# Patient Record
Sex: Female | Born: 1955 | Race: White | Hispanic: No | Marital: Married | Smoking: Current every day smoker
Health system: Southern US, Community
[De-identification: ages and names within clinical notes are randomized; demographics above are authoritative.]

## PROBLEM LIST (undated history)

## (undated) DIAGNOSIS — C801 Malignant (primary) neoplasm, unspecified: Secondary | ICD-10-CM

## (undated) DIAGNOSIS — L0100 Impetigo, unspecified: Secondary | ICD-10-CM

## (undated) DIAGNOSIS — J45909 Unspecified asthma, uncomplicated: Secondary | ICD-10-CM

## (undated) HISTORY — PX: CHOLECYSTECTOMY: SHX55

## (undated) HISTORY — PX: ABDOMINAL HYSTERECTOMY: SHX81

## (undated) HISTORY — PX: EYE SURGERY: SHX253

---

## 2004-09-25 ENCOUNTER — Ambulatory Visit: Payer: Self-pay | Admitting: Family Medicine

## 2005-11-25 ENCOUNTER — Ambulatory Visit: Payer: Self-pay | Admitting: Family Medicine

## 2006-08-02 IMAGING — US MM MAMMO DIAGNOSTIC UNILATERAL*R*
1 series · 16 of 16 positions shown · non-contrast
Comparison: none

REASON FOR EXAM: Six-month followup RIGHT breast nodules
COMMENTS:

[Series 1: mm mammo diagnostic unilateral*right* · 16 of 19 slices shown]
[im 1/19]
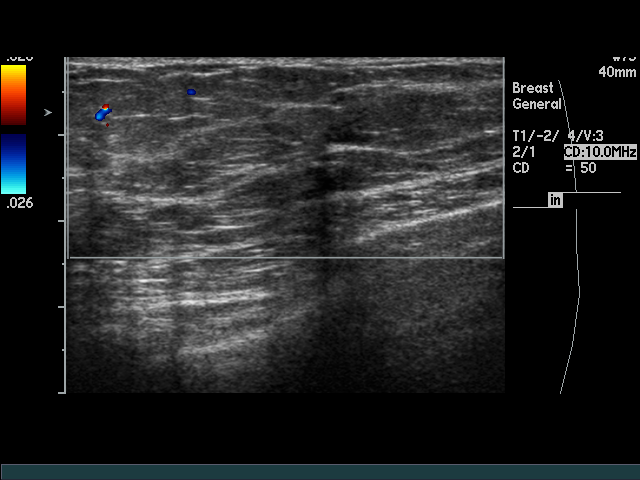
[im 2/19]
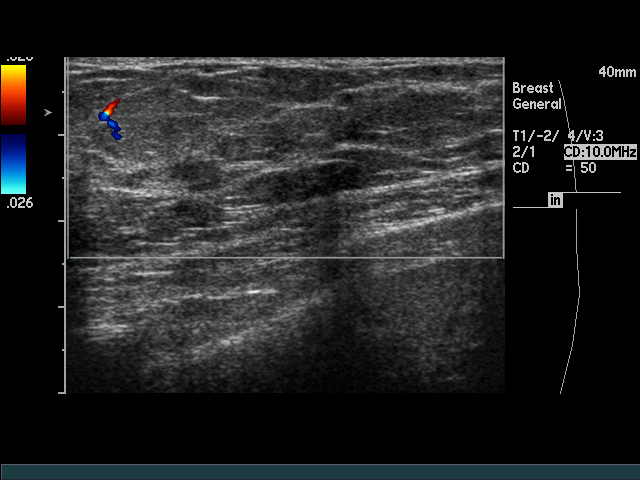
[im 3/19]
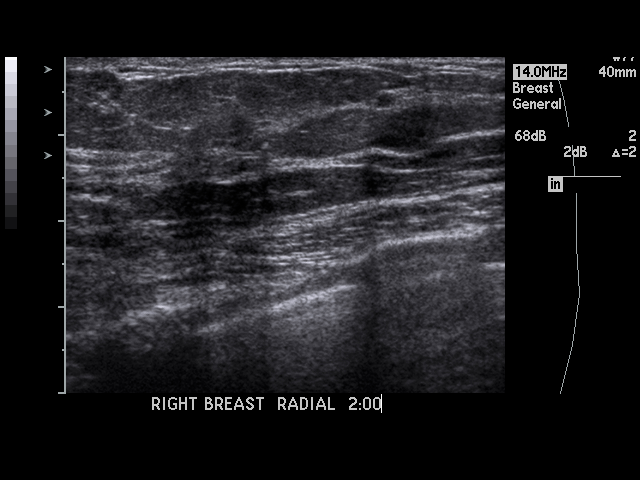
[im 4/19]
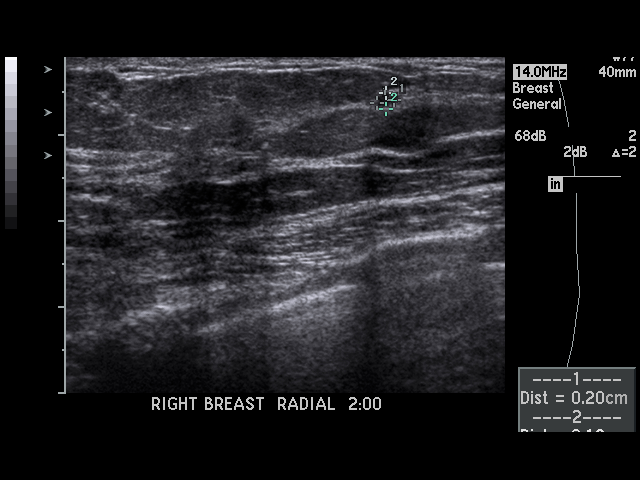
[im 5/19]
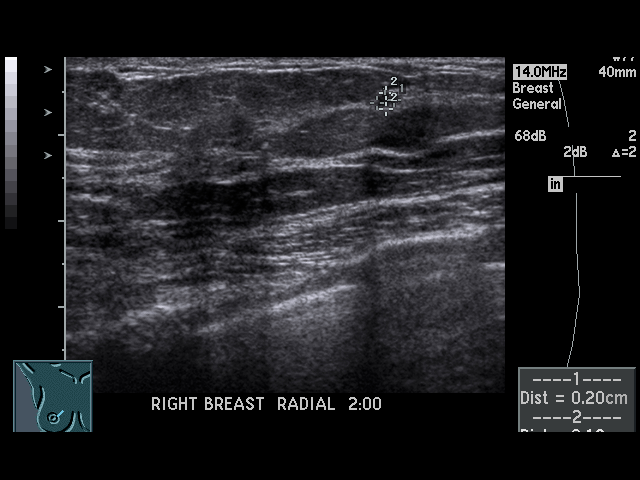
[im 7/19]
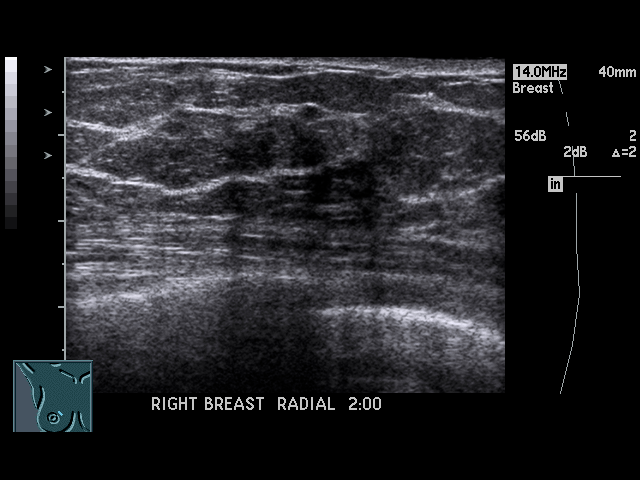
[im 8/19]
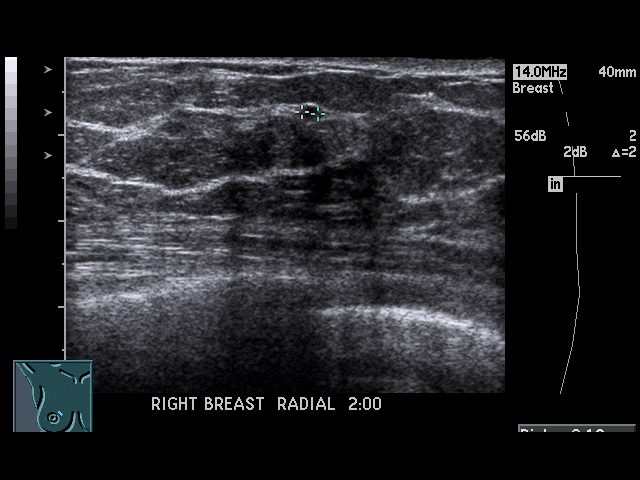
[im 9/19]
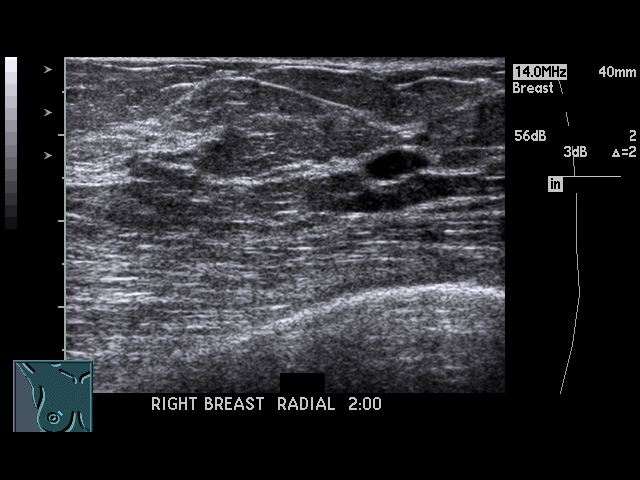
[im 10/19]
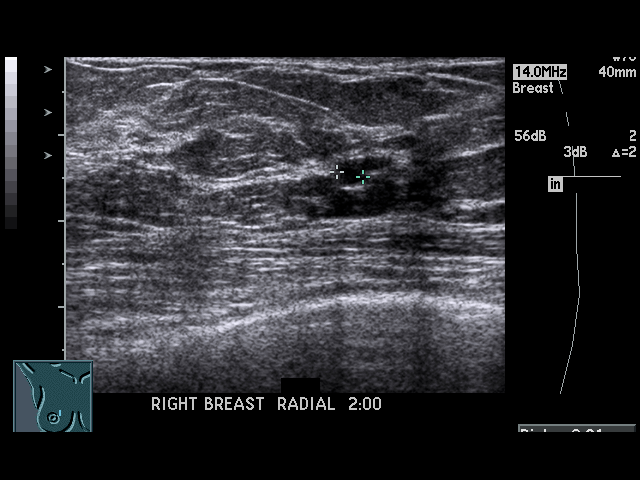
[im 11/19]
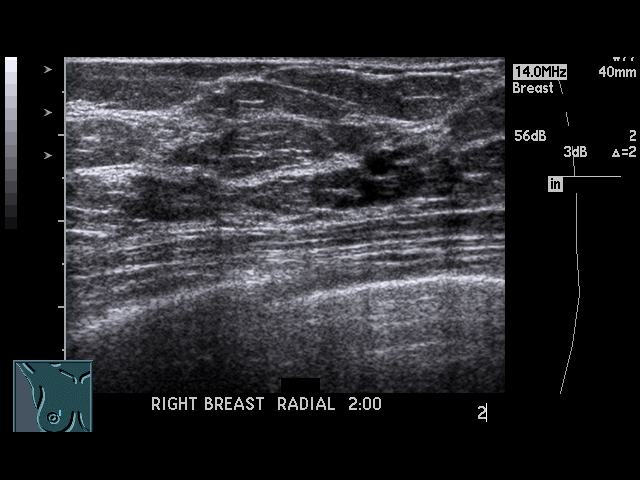
[im 13/19]
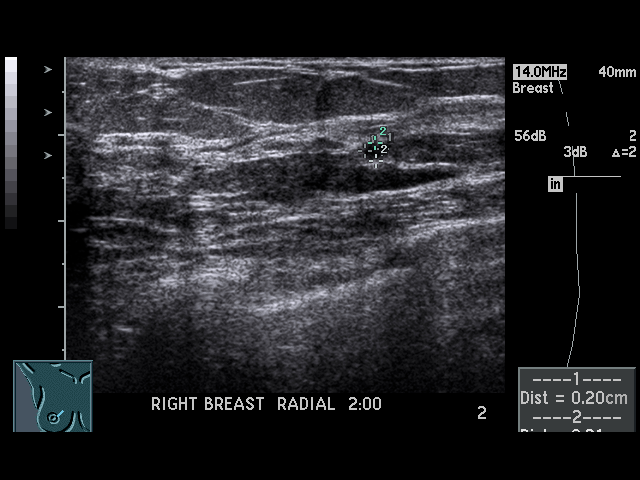
[im 14/19]
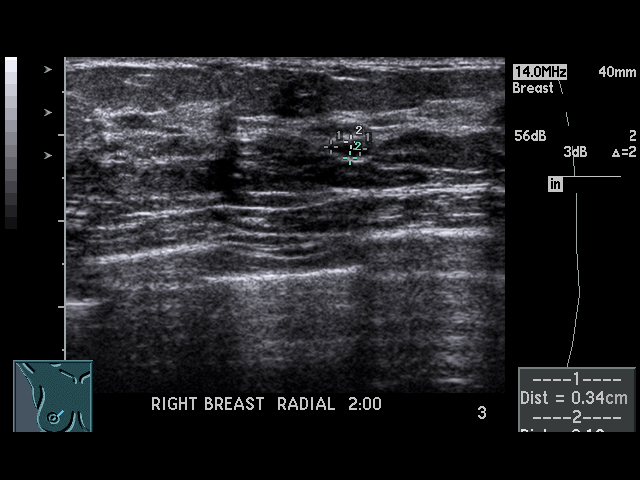
[im 15/19]
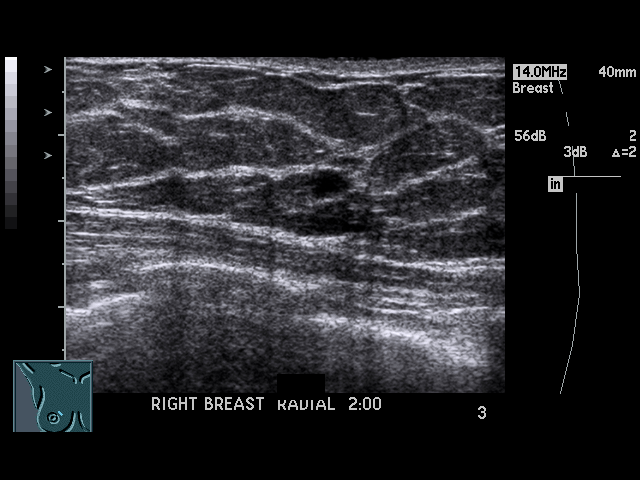
[im 16/19]
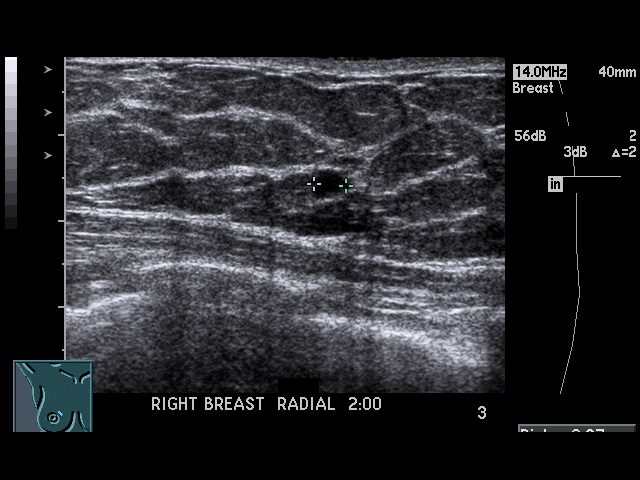
[im 17/19]
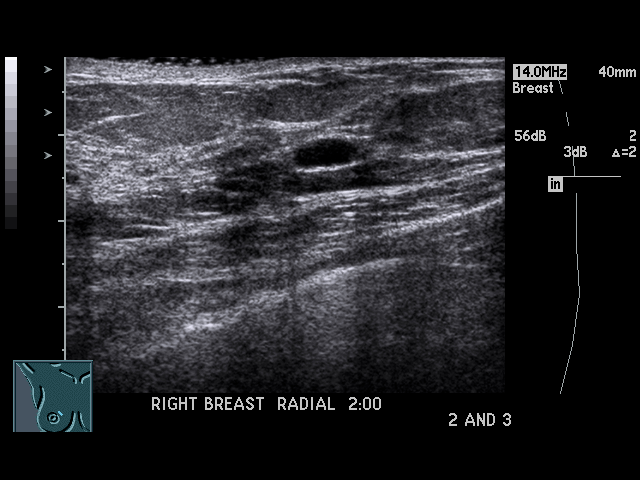
[im 19/19]
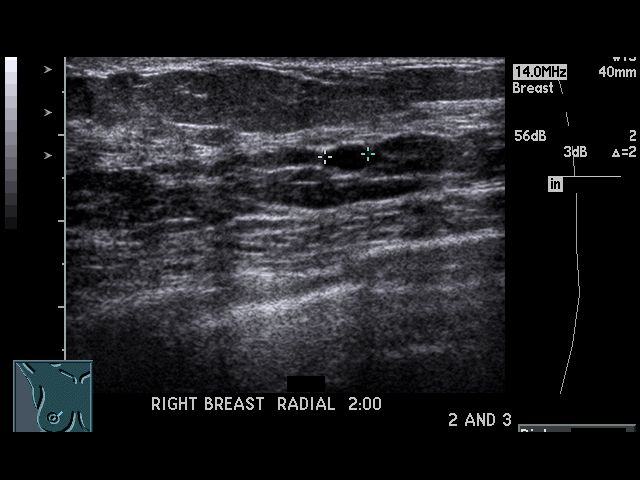

[16 of 16 positions shown; findings below may reference images not displayed]

PROCEDURE:     MAM - MAM DGTL UNI MAM RT BREAST W/CAD  - September 25, 2004  [DATE]

RESULT:     At the time of the prior study of 02-25-04, nodularity at the 2
o'clock position on the RIGHT was seen and was felt to represent simple
cystic structures.  On today's study the RIGHT breast remains moderately
dense.  Nodularity is noted in the medial aspect of the RIGHT breast in a
fashion similar to that seen in the past.  Ultrasound performed today
reveals no new mass.  There are tiny simple-appearing cystic structures
present at the 2 o'clock position.
IMPRESSION: I see no findings suspicious for malignancy.

BI-RADS: Category 2 - Benign Finding.

RECOMMENDATION: Please return to yearly mammographic followup of both
breasts i.e. in [REDACTED] or March 2005.

A NEGATIVE MAMMOGRAM REPORT DOES NOT PRECLUDE BIOPSY OR OTHER EVALUATION OF
A CLINICALLY PALPABLE OR OTHERWISE SUSPICIOUS MASS OR LESION.  BREAST CANCER
MAY NOT BE DETECTED BY MAMMOGRAPHY IN UP TO 10% OF CASES.

## 2006-12-01 ENCOUNTER — Ambulatory Visit: Payer: Self-pay | Admitting: Family Medicine

## 2009-09-20 ENCOUNTER — Emergency Department: Payer: Self-pay | Admitting: Emergency Medicine

## 2009-09-22 ENCOUNTER — Ambulatory Visit: Payer: Self-pay | Admitting: Surgery

## 2011-07-28 IMAGING — CR DG ABDOMEN 2V
1 series · 2 of 2 positions shown · non-contrast
Comparison: none

REASON FOR EXAM: ABDOMINAL PAIN
COMMENTS:   May transport without cardiac monitor

PROCEDURE:     DXR - DXR ABDOMEN 2 V FLAT AND ERECT  - September 20, 2009  [DATE]
RESULT:
Air is seen within nondilated loops of large and small bowel. The visualized
bony skeleton demonstrates no evidence of fracture nor dislocation. There is
no evidence of free air.

[Series 1: view not recorded · 0.17mm/px · 2 of 2 slices shown]
[im 1/2]
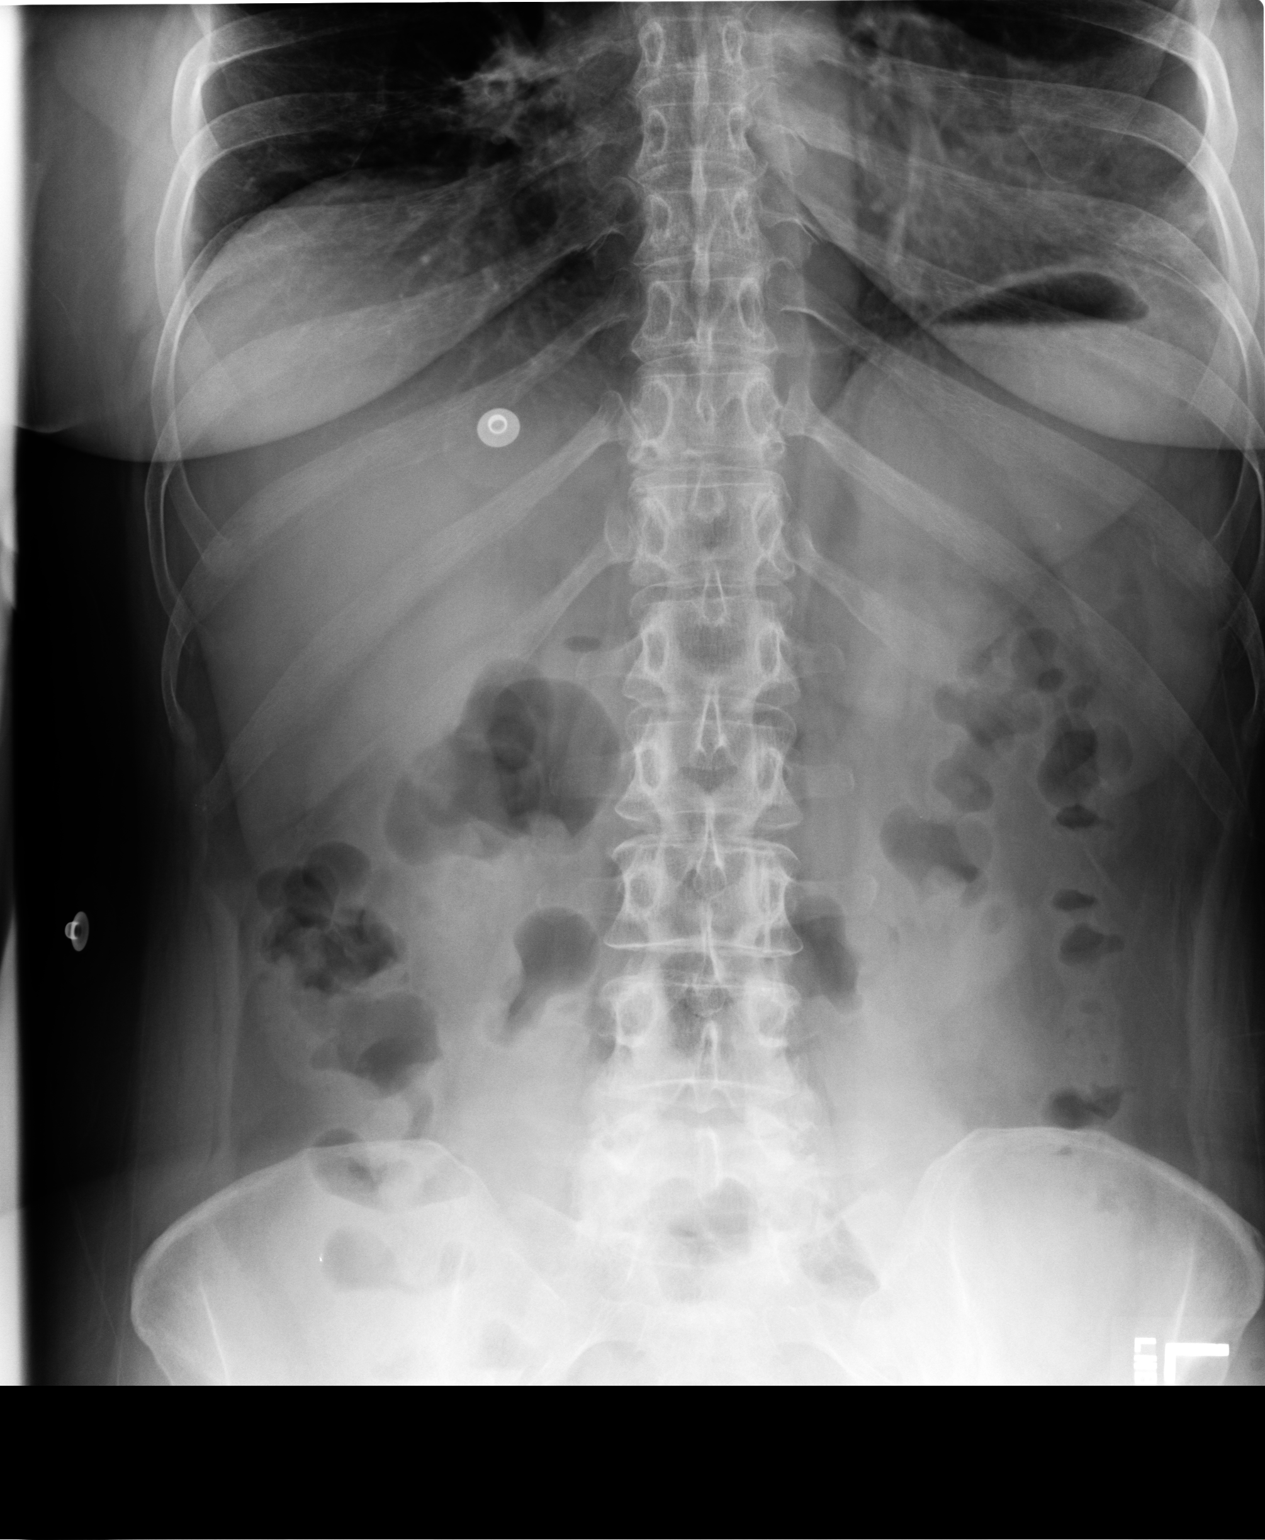
[im 2/2]
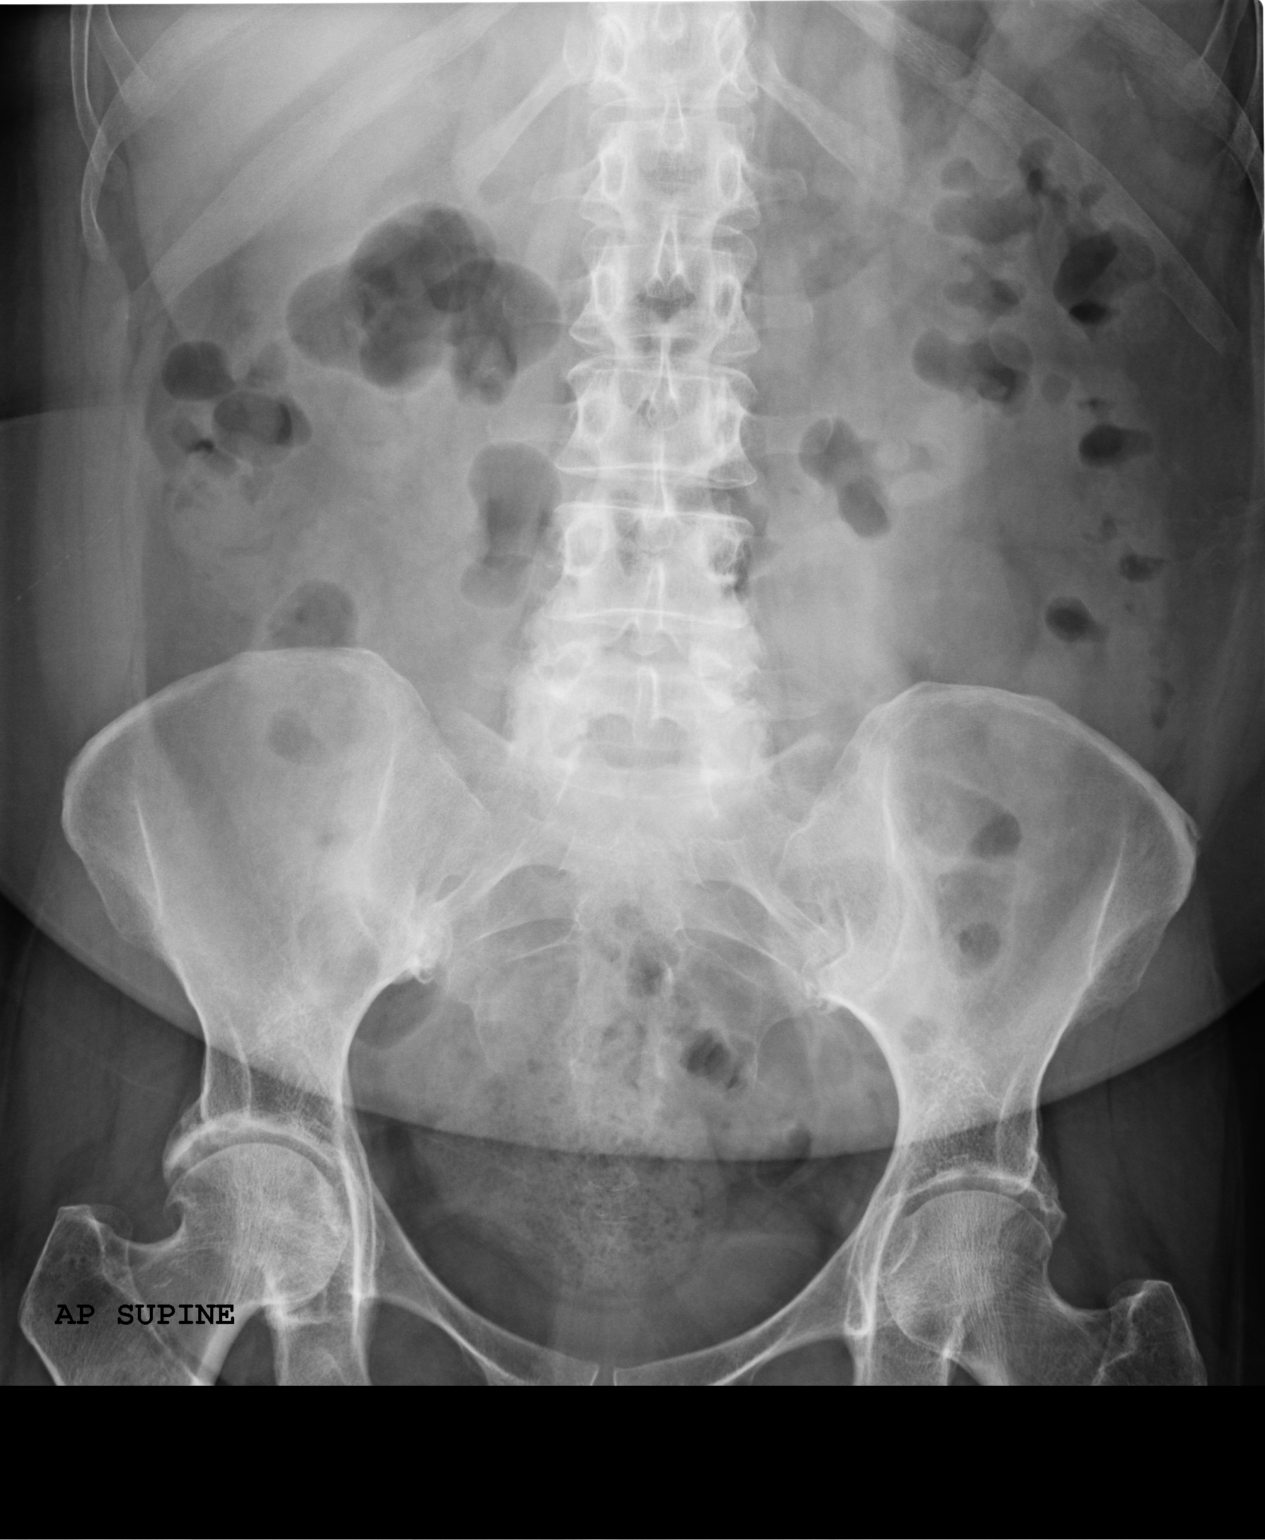

[2 of 2 positions shown; findings below may reference images not displayed]

IMPRESSION: Nonobstructive bowel gas pattern as described above.

## 2016-01-07 ENCOUNTER — Encounter: Payer: Self-pay | Admitting: *Deleted

## 2016-01-08 ENCOUNTER — Ambulatory Visit: Payer: BLUE CROSS/BLUE SHIELD | Admitting: Anesthesiology

## 2016-01-08 ENCOUNTER — Encounter
Admission: RE | Disposition: A | Discharge status: Home / Self Care | Payer: Self-pay | Source: Ambulatory Visit | Attending: Unknown Physician Specialty

## 2016-01-08 ENCOUNTER — Ambulatory Visit
Admission: RE | Admit: 2016-01-08 | Discharge: 2016-01-08 | Disposition: A | Discharge status: Home / Self Care | Payer: BLUE CROSS/BLUE SHIELD | Source: Ambulatory Visit | Attending: Unknown Physician Specialty | Admitting: Unknown Physician Specialty

## 2016-01-08 DIAGNOSIS — Z1211 Encounter for screening for malignant neoplasm of colon: Secondary | ICD-10-CM | POA: Diagnosis present

## 2016-01-08 DIAGNOSIS — K640 First degree hemorrhoids: Secondary | ICD-10-CM | POA: Insufficient documentation

## 2016-01-08 DIAGNOSIS — D123 Benign neoplasm of transverse colon: Secondary | ICD-10-CM | POA: Insufficient documentation

## 2016-01-08 DIAGNOSIS — F17210 Nicotine dependence, cigarettes, uncomplicated: Secondary | ICD-10-CM | POA: Insufficient documentation

## 2016-01-08 DIAGNOSIS — Z79899 Other long term (current) drug therapy: Secondary | ICD-10-CM | POA: Insufficient documentation

## 2016-01-08 DIAGNOSIS — K644 Residual hemorrhoidal skin tags: Secondary | ICD-10-CM | POA: Diagnosis not present

## 2016-01-08 DIAGNOSIS — Z8371 Family history of colonic polyps: Secondary | ICD-10-CM | POA: Diagnosis not present

## 2016-01-08 DIAGNOSIS — K5730 Diverticulosis of large intestine without perforation or abscess without bleeding: Secondary | ICD-10-CM | POA: Insufficient documentation

## 2016-01-08 DIAGNOSIS — J45909 Unspecified asthma, uncomplicated: Secondary | ICD-10-CM | POA: Insufficient documentation

## 2016-01-08 HISTORY — DX: Impetigo, unspecified: L01.00

## 2016-01-08 HISTORY — PX: COLONOSCOPY WITH PROPOFOL: SHX5780

## 2016-01-08 HISTORY — DX: Unspecified asthma, uncomplicated: J45.909

## 2016-01-08 SURGERY — COLONOSCOPY WITH PROPOFOL
Anesthesia: General

## 2016-01-08 MED ORDER — SODIUM CHLORIDE 0.9 % IV SOLN
INTRAVENOUS | Status: DC
Start: 2016-01-08 — End: 2016-01-08

## 2016-01-08 MED ORDER — PROPOFOL 500 MG/50ML IV EMUL
INTRAVENOUS | Status: DC | PRN
  Administered 2016-01-08: 120 ug/kg/min via INTRAVENOUS

## 2016-01-08 MED ORDER — FENTANYL CITRATE (PF) 100 MCG/2ML IJ SOLN
INTRAMUSCULAR | Status: DC | PRN
  Administered 2016-01-08: 50 ug via INTRAVENOUS

## 2016-01-08 MED ORDER — SODIUM CHLORIDE 0.9 % IV SOLN
INTRAVENOUS | Status: DC
  Administered 2016-01-08: 1000 mL via INTRAVENOUS

## 2016-01-08 MED ORDER — MIDAZOLAM HCL 2 MG/2ML IJ SOLN
INTRAMUSCULAR | Status: DC | PRN
  Administered 2016-01-08: 1 mg via INTRAVENOUS

## 2016-01-08 NOTE — Anesthesia Postprocedure Evaluation (Signed)
Anesthesia Post Note  Patient: Wanda Gomez  Procedure(s) Performed: Procedure(s) (LRB): COLONOSCOPY WITH PROPOFOL (N/A)  Patient location during evaluation: PACU Anesthesia Type: General Level of consciousness: awake and alert Pain management: pain level controlled Vital Signs Assessment: post-procedure vital signs reviewed and stable Respiratory status: spontaneous breathing, nonlabored ventilation, respiratory function stable and patient connected to nasal cannula oxygen Cardiovascular status: blood pressure returned to baseline and stable Postop Assessment: no signs of nausea or vomiting Anesthetic complications: no    Last Vitals:  Filed Vitals:   01/08/16 0921 01/08/16 1007  BP: 142/98 105/50  Pulse: 86 75  Temp: 36.3 C 36.4 C  Resp: 16 17    Last Pain: There were no vitals filed for this visit.               Molli Barrows

## 2016-01-08 NOTE — Transfer of Care (Signed)
Immediate Anesthesia Transfer of Care Note  Patient: Wanda Gomez  Procedure(s) Performed: Procedure(s): COLONOSCOPY WITH PROPOFOL (N/A)  Patient Location: PACU  Anesthesia Type:General  Level of Consciousness: awake, alert , oriented and sedated  Airway & Oxygen Therapy: Patient Spontanous Breathing and Patient connected to nasal cannula oxygen  Post-op Assessment: Report given to RN and Post -op Vital signs reviewed and stable  Post vital signs: Reviewed and stable  Last Vitals:  Filed Vitals:   01/08/16 0921 01/08/16 1007  BP: 142/98 105/50  Pulse: 86 75  Temp: 36.3 C 36.4 C  Resp: 16 17    Last Pain: There were no vitals filed for this visit.       Complications: No apparent anesthesia complications

## 2016-01-08 NOTE — H&P (Signed)
   Primary Care Physician:  Lynnell Jude, MD Primary Gastroenterologist:  Dr. Vira Agar  Pre-Procedure History & Physical: HPI:  Wanda Gomez is a 60 y.o. female is here for an colonoscopy.   Past Medical History  Diagnosis Date  . Asthma   . Impetigo     Past Surgical History  Procedure Laterality Date  . Cholecystectomy    . Abdominal hysterectomy    . Eye surgery      Prior to Admission medications   Medication Sig Start Date End Date Taking? Authorizing Provider  albuterol (PROVENTIL HFA;VENTOLIN HFA) 108 (90 Base) MCG/ACT inhaler Inhale 2 puffs into the lungs every 6 (six) hours as needed for wheezing or shortness of breath.   Yes Historical Provider, MD  fluticasone-salmeterol (ADVAIR HFA) 230-21 MCG/ACT inhaler Inhale 2 puffs into the lungs 2 (two) times daily.   Yes Historical Provider, MD  montelukast (SINGULAIR) 10 MG tablet Take 10 mg by mouth at bedtime.   Yes Historical Provider, MD    Allergies as of 12/29/2015  . (Not on File)    History reviewed. No pertinent family history.  Social History   Social History  . Marital Status: Married    Spouse Name: N/A  . Number of Children: N/A  . Years of Education: N/A   Occupational History  . Not on file.   Social History Main Topics  . Smoking status: Current Every Day Smoker -- 0.50 packs/day    Types: Cigarettes  . Smokeless tobacco: Not on file  . Alcohol Use: No  . Drug Use: No  . Sexual Activity: Not on file   Other Topics Concern  . Not on file   Social History Narrative    Review of Systems: See HPI, otherwise negative ROS  Physical Exam: BP 142/98 mmHg  Pulse 86  Temp(Src) 97.4 F (36.3 C) (Tympanic)  Resp 16  Ht 5\' 2"  (1.575 m)  Wt 81.647 kg (180 lb)  BMI 32.91 kg/m2  SpO2 100% General:   Alert,  pleasant and cooperative in NAD Head:  Normocephalic and atraumatic. Neck:  Supple; no masses or thyromegaly. Lungs:  Clear throughout to auscultation.    Heart:  Regular rate and  rhythm. Abdomen:  Soft, nontender and nondistended. Normal bowel sounds, without guarding, and without rebound.   Neurologic:  Alert and  oriented x4;  grossly normal neurologically.  Impression/Plan: Wanda Gomez is here for an colonoscopy to be performed for FH colon polyps in mother and sister  Risks, benefits, limitations, and alternatives regarding  colonoscopy have been reviewed with the patient.  Questions have been answered.  All parties agreeable.   Gaylyn Cheers, MD  01/08/2016, 9:35 AM

## 2016-01-08 NOTE — Anesthesia Procedure Notes (Signed)
Performed by: COOK-MARTIN, Saara Kijowski Pre-anesthesia Checklist: Patient identified, Emergency Drugs available, Suction available, Patient being monitored and Timeout performed Patient Re-evaluated:Patient Re-evaluated prior to inductionOxygen Delivery Method: Nasal cannula Preoxygenation: Pre-oxygenation with 100% oxygen Intubation Type: IV induction Placement Confirmation: CO2 detector and positive ETCO2       

## 2016-01-08 NOTE — Op Note (Signed)
[facility] Gastroenterology Patient Name: Wanda Gomez Procedure Date: 01/08/2016 9:36 AM MRN: [id number] Account #: [id number] Date of Birth: [birth year] Admit Type: Outpatient Age: 60 Room: [facility] ENDO ROOM 2 Gender: Female Note Status: Finalized Procedure:            Colonoscopy Indications:          Screening for colorectal malignant neoplasm, Colon                        cancer screening in patient at increased risk: Family                        history of 1st-degree relative with colon polyps Providers:            Manya Silvas, MD Referring MD:         Lynnell Jude (Referring MD) Medicines:            Propofol per Anesthesia Complications:        No immediate complications. Procedure:            Pre-Anesthesia Assessment:                       - After reviewing the risks and benefits, the patient                        was deemed in satisfactory condition to undergo the                        procedure.                       After obtaining informed consent, the colonoscope was                        passed under direct vision. Throughout the procedure,                        the patient's blood pressure, pulse, and oxygen                        saturations were monitored continuously. The                        Colonoscope was introduced through the anus and                        advanced to the the cecum, identified by appendiceal                        orifice and ileocecal valve. The colonoscopy was                        somewhat difficult due to restricted mobility of the                        colon and a tortuous colon. The patient tolerated the                        procedure well. The quality of the bowel preparation  was excellent. Findings:      A small polyp was found in the transverse colon. The polyp was sessile.       The polyp was removed with a hot snare. Resection and retrieval were       complete.  Multiple small-mouthed diverticula were found in the sigmoid colon.      External and internal hemorrhoids were found during endoscopy. The       hemorrhoids were small and Grade I (internal hemorrhoids that do not       prolapse).      The exam was otherwise without abnormality. Impression:           - One small polyp in the transverse colon, removed with                        a hot snare. Resected and retrieved.                       - Diverticulosis in the sigmoid colon.                       - External and internal hemorrhoids.                       - The examination was otherwise normal. Recommendation:       - Await pathology results. Manya Silvas, MD 01/08/2016 10:04:31 AM This report has been signed electronically. Number of Addenda: 0 Note Initiated On: 01/08/2016 9:36 AM Scope Withdrawal Time: 0 hours 11 minutes 40 seconds  Total Procedure Duration: 0 hours 18 minutes 8 seconds       [facility]

## 2016-01-08 NOTE — Anesthesia Preprocedure Evaluation (Signed)
Anesthesia Evaluation  Patient identified by MRN, date of birth, ID band Patient awake    Reviewed: Allergy & Precautions, H&P , NPO status , Patient's Chart, lab work & pertinent test results, reviewed documented beta blocker date and time   Airway Mallampati: II   Neck ROM: full    Dental  (+) Poor Dentition   Pulmonary neg pulmonary ROS, asthma , Current Smoker,    Pulmonary exam normal        Cardiovascular negative cardio ROS Normal cardiovascular exam     Neuro/Psych negative neurological ROS  negative psych ROS   GI/Hepatic negative GI ROS, Neg liver ROS,   Endo/Other  negative endocrine ROS  Renal/GU negative Renal ROS  negative genitourinary   Musculoskeletal   Abdominal   Peds  Hematology negative hematology ROS (+)   Anesthesia Other Findings Past Medical History:   Asthma                                                       Impetigo                                                   Past Surgical History:   CHOLECYSTECTOMY                                               ABDOMINAL HYSTERECTOMY                                        EYE SURGERY                                                 BMI    Body Mass Index   32.91 kg/m 2     Reproductive/Obstetrics                             Anesthesia Physical Anesthesia Plan  ASA: II  Anesthesia Plan: General   Post-op Pain Management:    Induction:   Airway Management Planned:   Additional Equipment:   Intra-op Plan:   Post-operative Plan:   Informed Consent: I have reviewed the patients History and Physical, chart, labs and discussed the procedure including the risks, benefits and alternatives for the proposed anesthesia with the patient or authorized representative who has indicated his/her understanding and acceptance.   Dental Advisory Given  Plan Discussed with: CRNA  Anesthesia Plan Comments:          Anesthesia Quick Evaluation

## 2016-01-09 LAB — SURGICAL PATHOLOGY

## 2016-02-04 ENCOUNTER — Other Ambulatory Visit: Payer: Self-pay | Admitting: Family Medicine

## 2016-02-04 DIAGNOSIS — Z1231 Encounter for screening mammogram for malignant neoplasm of breast: Secondary | ICD-10-CM

## 2016-02-24 ENCOUNTER — Ambulatory Visit
Admission: RE | Admit: 2016-02-24 | Discharge: 2016-02-24 | Disposition: A | Discharge status: Home / Self Care | Payer: BLUE CROSS/BLUE SHIELD | Source: Ambulatory Visit | Attending: Family Medicine | Admitting: Family Medicine

## 2016-02-24 DIAGNOSIS — Z1231 Encounter for screening mammogram for malignant neoplasm of breast: Secondary | ICD-10-CM | POA: Diagnosis not present

## 2016-02-24 HISTORY — DX: Malignant (primary) neoplasm, unspecified: C80.1

## 2016-02-25 ENCOUNTER — Other Ambulatory Visit: Payer: Self-pay | Admitting: Family Medicine

## 2016-02-25 DIAGNOSIS — N6320 Unspecified lump in the left breast, unspecified quadrant: Secondary | ICD-10-CM

## 2016-03-10 ENCOUNTER — Ambulatory Visit
Admission: RE | Admit: 2016-03-10 | Discharge: 2016-03-10 | Disposition: A | Discharge status: Home / Self Care | Payer: BLUE CROSS/BLUE SHIELD | Source: Ambulatory Visit | Attending: Family Medicine | Admitting: Family Medicine

## 2016-03-10 DIAGNOSIS — N63 Unspecified lump in breast: Secondary | ICD-10-CM | POA: Diagnosis present

## 2016-03-10 DIAGNOSIS — N6320 Unspecified lump in the left breast, unspecified quadrant: Secondary | ICD-10-CM

## 2018-01-19 IMAGING — US US BREAST*L* LIMITED INC AXILLA
1 series · 5 of 5 positions shown · non-contrast
Comparison: Previous exam(s).

CLINICAL DATA: Callback from screening mammogram

EXAM:
2D DIGITAL DIAGNOSTIC LEFT MAMMOGRAM WITH CAD AND ADJUNCT TOMO
ULTRASOUND LEFT BREAST

[Series 1: us breast*left* limited inc axilla · 0.06mm/px · 5 of 5 slices shown]
[im 1/5]
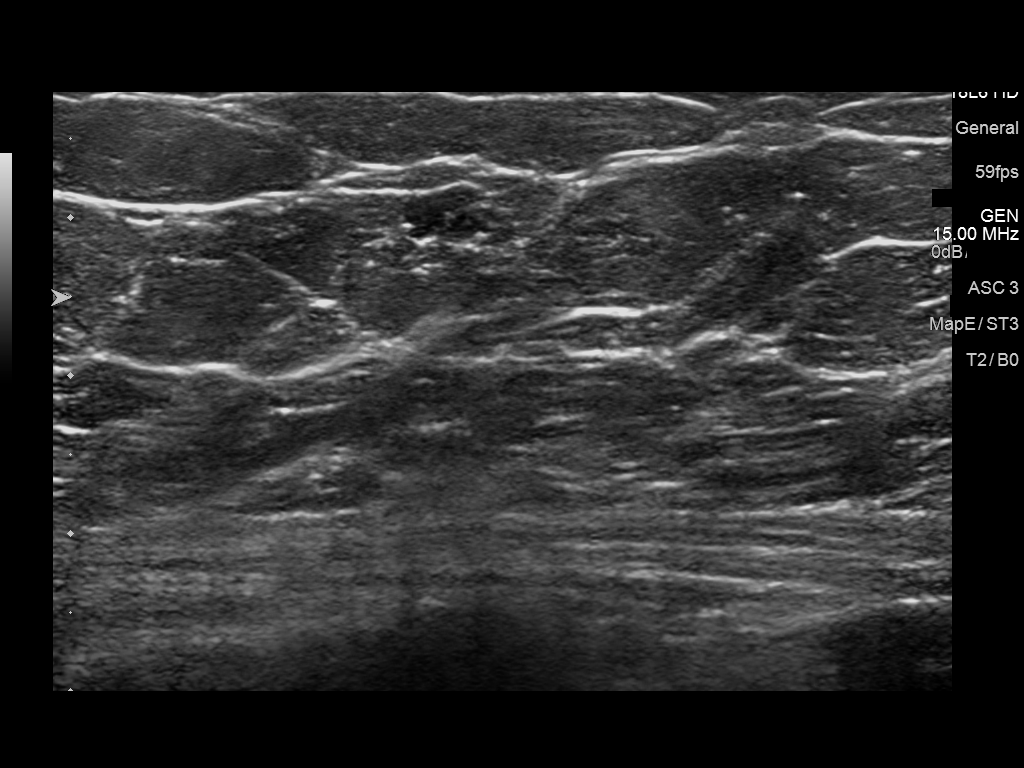
[im 2/5]
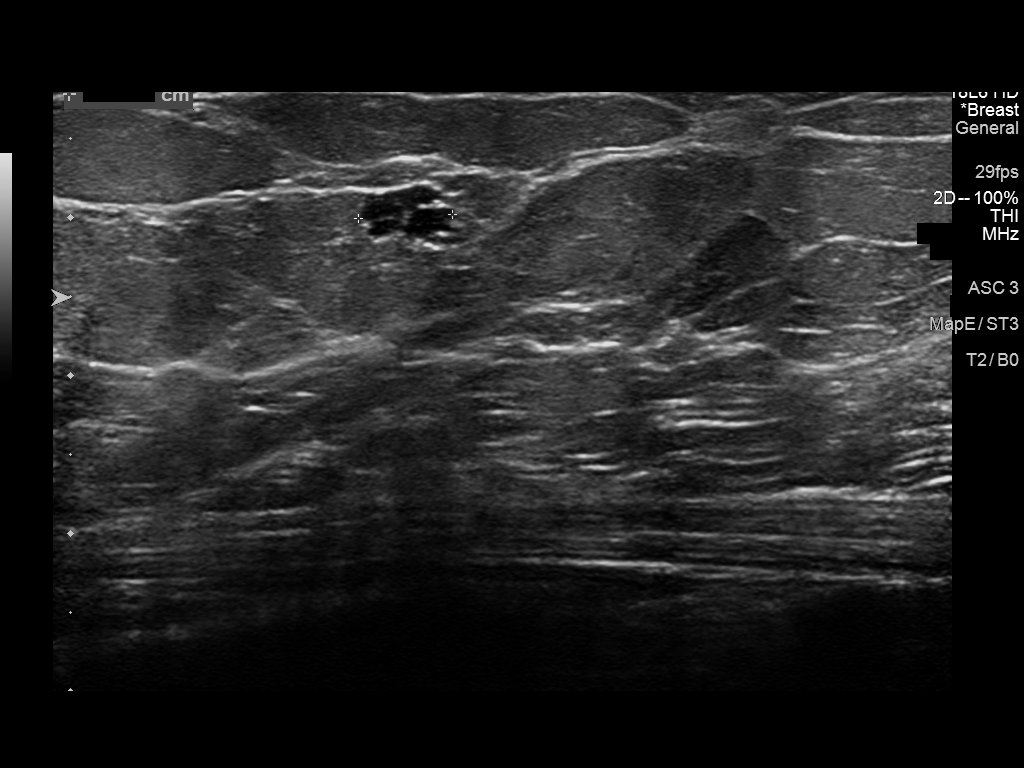
[im 3/5]
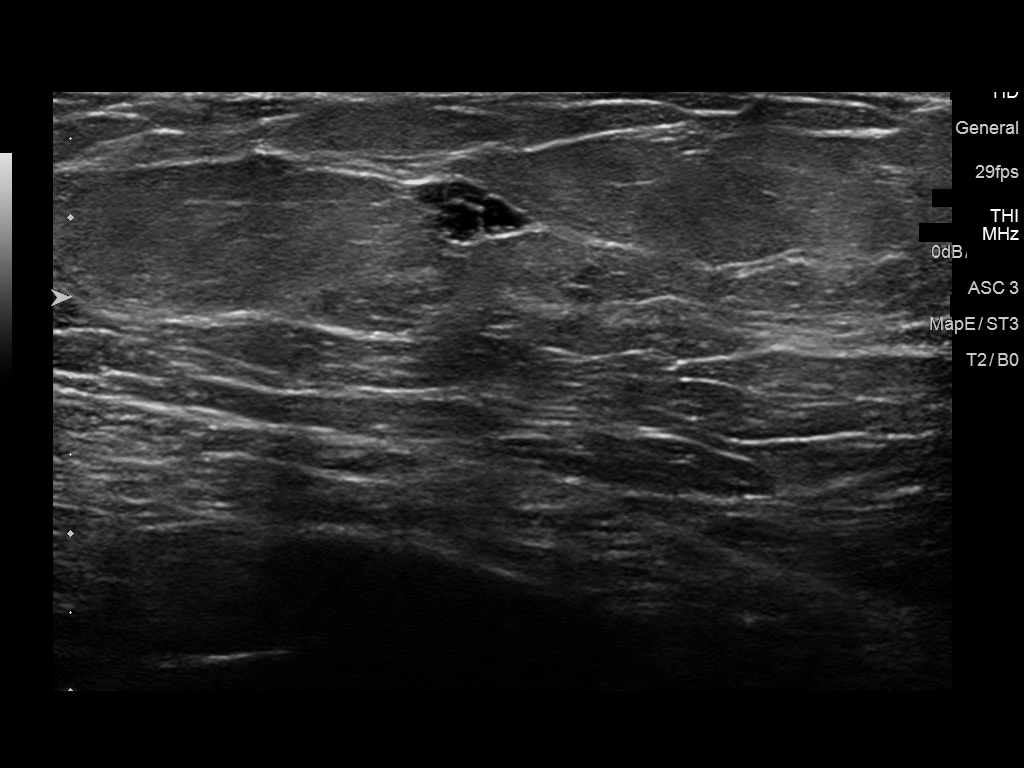
[im 4/5]
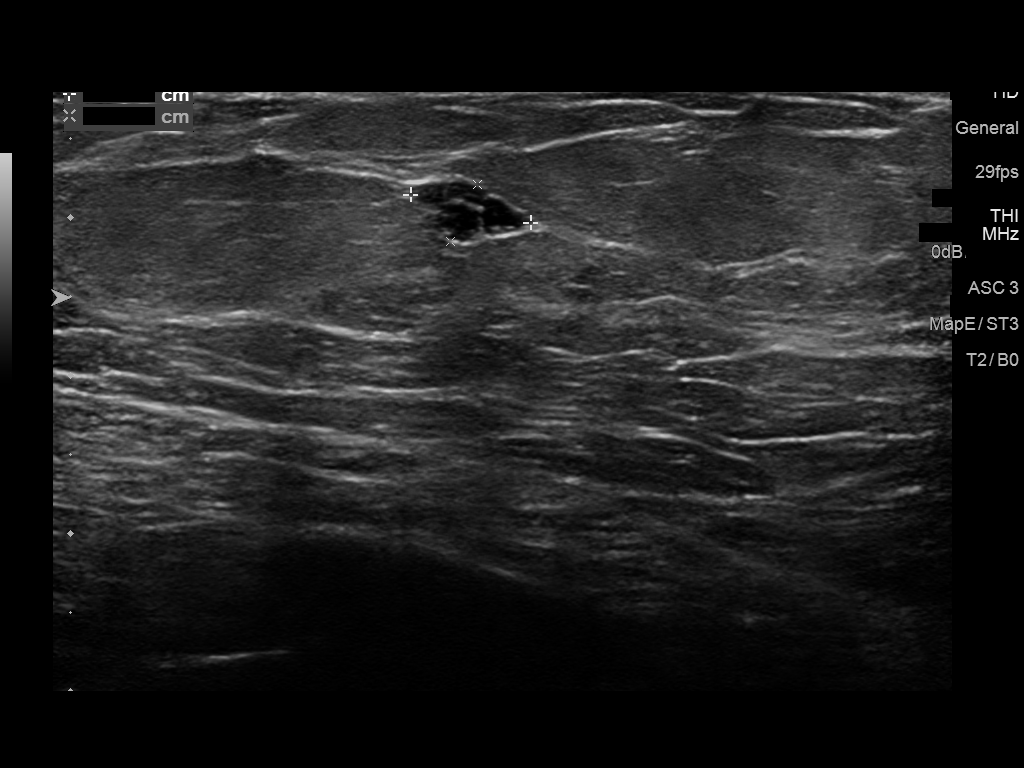
[im 5/5]
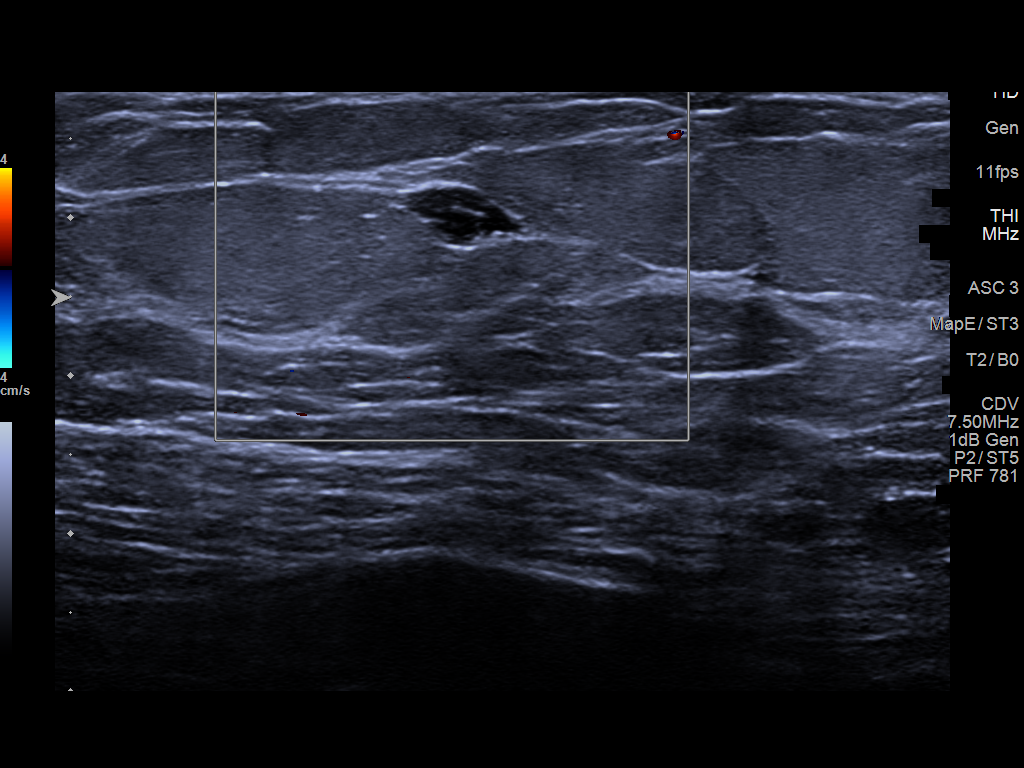

[5 of 5 positions shown; findings below may reference images not displayed]

ACR Breast Density Category b: There are scattered areas of
fibroglandular density.
FINDINGS: On the additional views, there is a 7 mm oval, circumscribed mass
within the medial left breast, corresponding to the finding seen on
screening mammogram.

Mammographic images were processed with CAD.

On physical exam, no discrete mass is felt in the area of concern
within the medial left breast.

Targeted ultrasound is performed, showing a probable cluster of
cysts at 10 o'clock, 5 cm from the nipple measuring 6 x 8 x 4 mm,
corresponding the mass seen mammographically. No internal
vascularity is identified.
IMPRESSION: Probably benign left breast finding.

RECOMMENDATION:
Left breast ultrasound in 6 months. Options including short-term
follow-up versus ultrasound-guided biopsy were discussed with the
patient. The patient wishes to pursue short-term follow-up.

I have discussed the findings and recommendations with the patient.
Results were also provided in writing at the conclusion of the
visit. If applicable, a reminder letter will be sent to the patient
regarding the next appointment.

BI-RADS CATEGORY  3: Probably benign.

## 2020-12-18 ENCOUNTER — Other Ambulatory Visit: Payer: Self-pay | Admitting: Family Medicine

## 2020-12-18 DIAGNOSIS — N6002 Solitary cyst of left breast: Secondary | ICD-10-CM
